# Patient Record
Sex: Female | Born: 1935 | Race: White | Hispanic: No
Health system: Southern US, Community
[De-identification: ages and names within clinical notes are randomized; demographics above are authoritative.]

---

## 1997-09-01 ENCOUNTER — Ambulatory Visit (HOSPITAL_COMMUNITY): Admission: RE | Admit: 1997-09-01 | Discharge: 1997-09-01 | Payer: Self-pay | Admitting: Internal Medicine

## 1997-12-09 ENCOUNTER — Other Ambulatory Visit: Admission: RE | Admit: 1997-12-09 | Discharge: 1997-12-09 | Payer: Self-pay | Admitting: Internal Medicine

## 1998-03-17 ENCOUNTER — Ambulatory Visit: Admission: RE | Admit: 1998-03-17 | Discharge: 1998-03-17 | Payer: Self-pay | Admitting: Internal Medicine

## 1998-04-13 ENCOUNTER — Ambulatory Visit: Admission: RE | Admit: 1998-04-13 | Discharge: 1998-04-13 | Payer: Self-pay | Admitting: Internal Medicine

## 1998-04-13 ENCOUNTER — Encounter: Payer: Self-pay | Admitting: Internal Medicine

## 1999-08-27 ENCOUNTER — Encounter: Payer: Self-pay | Admitting: Internal Medicine

## 1999-08-27 ENCOUNTER — Encounter: Admission: RE | Admit: 1999-08-27 | Discharge: 1999-08-27 | Payer: Self-pay | Admitting: Internal Medicine

## 2000-03-24 ENCOUNTER — Encounter: Payer: Self-pay | Admitting: Specialist

## 2000-03-27 ENCOUNTER — Ambulatory Visit (HOSPITAL_COMMUNITY): Admission: RE | Admit: 2000-03-27 | Discharge: 2000-03-27 | Payer: Self-pay | Admitting: Specialist

## 2000-04-18 ENCOUNTER — Ambulatory Visit (HOSPITAL_COMMUNITY): Admission: RE | Admit: 2000-04-18 | Discharge: 2000-04-18 | Payer: Self-pay | Admitting: Internal Medicine

## 2000-04-18 ENCOUNTER — Encounter: Payer: Self-pay | Admitting: Internal Medicine

## 2000-05-08 ENCOUNTER — Ambulatory Visit (HOSPITAL_COMMUNITY): Admission: RE | Admit: 2000-05-08 | Discharge: 2000-05-08 | Payer: Self-pay | Admitting: Specialist

## 2001-01-12 ENCOUNTER — Encounter: Payer: Self-pay | Admitting: Internal Medicine

## 2001-01-12 ENCOUNTER — Encounter: Admission: RE | Admit: 2001-01-12 | Discharge: 2001-01-12 | Payer: Self-pay | Admitting: Internal Medicine

## 2002-04-16 ENCOUNTER — Encounter: Payer: Self-pay | Admitting: Internal Medicine

## 2002-04-16 ENCOUNTER — Encounter: Admission: RE | Admit: 2002-04-16 | Discharge: 2002-04-16 | Payer: Self-pay | Admitting: Internal Medicine

## 2005-09-05 ENCOUNTER — Inpatient Hospital Stay (HOSPITAL_COMMUNITY): Admission: RE | Admit: 2005-09-05 | Discharge: 2005-09-09 | Payer: Self-pay | Admitting: Orthopedic Surgery

## 2005-10-18 ENCOUNTER — Ambulatory Visit (HOSPITAL_COMMUNITY): Admission: RE | Admit: 2005-10-18 | Discharge: 2005-10-18 | Payer: Self-pay | Admitting: Internal Medicine

## 2005-11-11 ENCOUNTER — Encounter: Admission: RE | Admit: 2005-11-11 | Discharge: 2005-11-11 | Payer: Self-pay | Admitting: Internal Medicine

## 2007-01-16 IMAGING — CR DG CHEST 2V
2 series · 2 of 2 positions shown · non-contrast
Comparison: Report of 03/24/2000 is reviewed.  Images are not available at time of dictation.

CLINICAL DATA: Preadmission for surgery.  Right knee implant.  Shortness of breath with exertion.
 CHEST - 2 VIEW:

[view not recorded (1 of 2)]
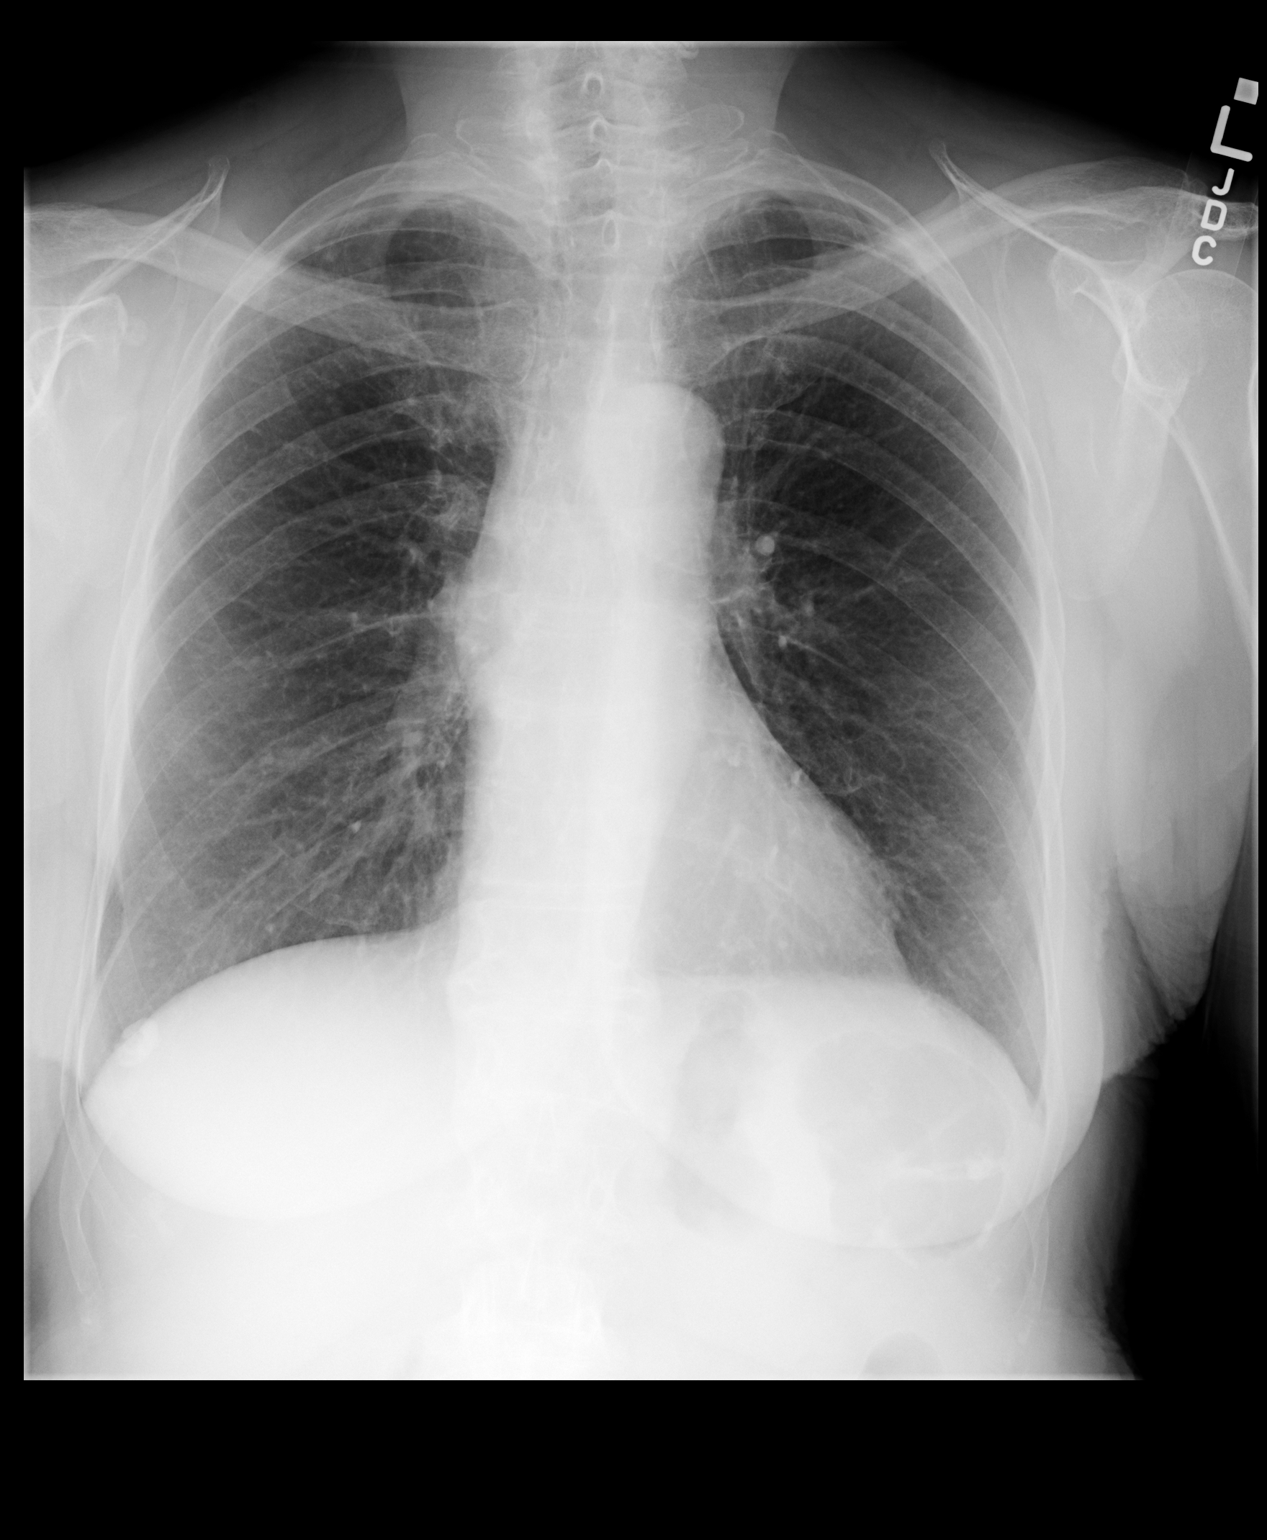

[view not recorded (2 of 2)]
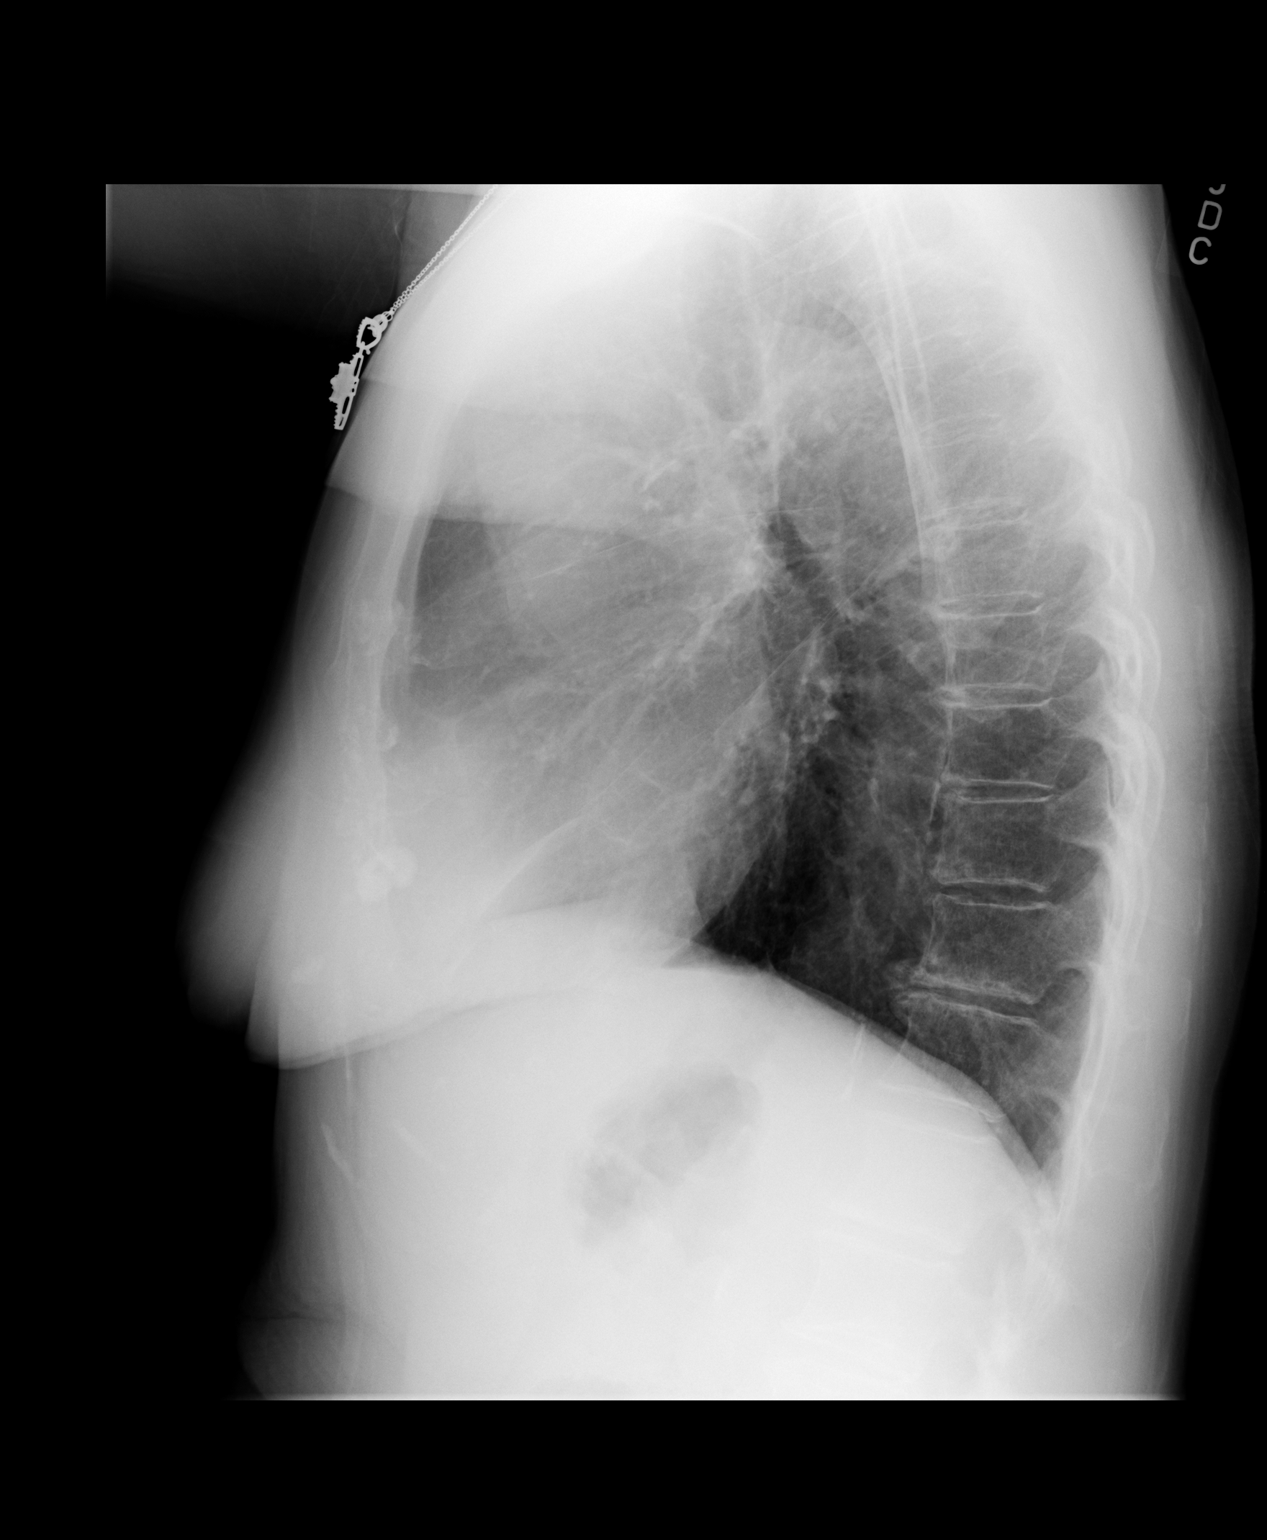

[2 of 2 positions shown; findings below may reference images not displayed]

FINDINGS: Trachea midline.  Heart size normal.  Ascending aorta is slightly prominent.  Biapical pleural thickening.  No pleural fluid.
IMPRESSION: No acute cardiopulmonary process.

## 2013-05-30 DEATH — deceased

## 2013-06-17 ENCOUNTER — Telehealth: Payer: Self-pay

## 2013-06-17 NOTE — Telephone Encounter (Signed)
Patient past away per Obituary in [place] [employer] °
